# Patient Record
Sex: Male | Born: 1980 | Race: White | Hispanic: No | Marital: Married | Smoking: Never smoker
Health system: Southern US, Community
[De-identification: ages and names within clinical notes are randomized; demographics above are authoritative.]

## PROBLEM LIST (undated history)

## (undated) DIAGNOSIS — K5792 Diverticulitis of intestine, part unspecified, without perforation or abscess without bleeding: Secondary | ICD-10-CM

---

## 2018-06-25 ENCOUNTER — Encounter: Payer: Self-pay | Admitting: Emergency Medicine

## 2018-06-25 ENCOUNTER — Emergency Department: Payer: Managed Care, Other (non HMO)

## 2018-06-25 ENCOUNTER — Emergency Department
Admission: EM | Admit: 2018-06-25 | Discharge: 2018-06-25 | Disposition: A | Discharge status: Home / Self Care | Payer: Managed Care, Other (non HMO) | Attending: Emergency Medicine | Admitting: Emergency Medicine

## 2018-06-25 DIAGNOSIS — M545 Low back pain: Secondary | ICD-10-CM | POA: Diagnosis not present

## 2018-06-25 DIAGNOSIS — M7918 Myalgia, other site: Secondary | ICD-10-CM

## 2018-06-25 MED ORDER — CYCLOBENZAPRINE HCL 10 MG PO TABS: 10.0000 mg | ORAL_TABLET | Freq: Three times a day (TID) | ORAL | 0 refills | Status: DC | PRN

## 2018-06-25 MED ORDER — CYCLOBENZAPRINE HCL 10 MG PO TABS
10.0000 mg | ORAL_TABLET | Freq: Once | ORAL | Status: AC
  Administered 2018-06-25: 10 mg via ORAL
  Filled 2018-06-25: qty 1

## 2018-06-25 MED ORDER — TRAMADOL HCL 50 MG PO TABS
50.0000 mg | ORAL_TABLET | Freq: Once | ORAL | Status: AC
  Administered 2018-06-25: 50 mg via ORAL
  Filled 2018-06-25: qty 1

## 2018-06-25 MED ORDER — IBUPROFEN 600 MG PO TABS: 600.0000 mg | ORAL_TABLET | Freq: Three times a day (TID) | ORAL | 0 refills | Status: DC | PRN

## 2018-06-25 MED ORDER — IBUPROFEN 600 MG PO TABS
600.0000 mg | ORAL_TABLET | Freq: Once | ORAL | Status: AC
  Administered 2018-06-25: 600 mg via ORAL
  Filled 2018-06-25: qty 1

## 2018-06-25 MED ORDER — TRAMADOL HCL 50 MG PO TABS: 50.0000 mg | ORAL_TABLET | Freq: Four times a day (QID) | ORAL | 0 refills | Status: DC | PRN

## 2018-06-25 NOTE — Discharge Instructions (Addendum)
Follow discharge care instruction take medication as directed. °
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
## 2019-02-28 ENCOUNTER — Emergency Department
Admission: EM | Admit: 2019-02-28 | Discharge: 2019-02-28 | Disposition: A | Discharge status: Home / Self Care | Payer: Managed Care, Other (non HMO) | Attending: Student | Admitting: Student

## 2019-02-28 ENCOUNTER — Other Ambulatory Visit: Payer: Self-pay

## 2019-02-28 ENCOUNTER — Emergency Department: Payer: Managed Care, Other (non HMO)

## 2019-02-28 ENCOUNTER — Encounter: Payer: Self-pay | Admitting: Emergency Medicine

## 2019-02-28 DIAGNOSIS — Y939 Activity, unspecified: Secondary | ICD-10-CM | POA: Diagnosis not present

## 2019-02-28 DIAGNOSIS — S161XXA Strain of muscle, fascia and tendon at neck level, initial encounter: Secondary | ICD-10-CM | POA: Insufficient documentation

## 2019-02-28 DIAGNOSIS — M7918 Myalgia, other site: Secondary | ICD-10-CM | POA: Diagnosis not present

## 2019-02-28 DIAGNOSIS — S199XXA Unspecified injury of neck, initial encounter: Secondary | ICD-10-CM | POA: Diagnosis present

## 2019-02-28 DIAGNOSIS — Y92410 Unspecified street and highway as the place of occurrence of the external cause: Secondary | ICD-10-CM | POA: Diagnosis not present

## 2019-02-28 DIAGNOSIS — Y999 Unspecified external cause status: Secondary | ICD-10-CM | POA: Insufficient documentation

## 2019-02-28 DIAGNOSIS — R413 Other amnesia: Secondary | ICD-10-CM | POA: Diagnosis not present

## 2019-02-28 MED ORDER — IBUPROFEN 600 MG PO TABS: 600.0000 mg | ORAL_TABLET | Freq: Three times a day (TID) | ORAL | 0 refills | Status: DC | PRN

## 2019-02-28 MED ORDER — CYCLOBENZAPRINE HCL 10 MG PO TABS: 10.0000 mg | ORAL_TABLET | Freq: Three times a day (TID) | ORAL | 0 refills | Status: DC | PRN

## 2019-02-28 MED ORDER — TRAMADOL HCL 50 MG PO TABS: 50.0000 mg | ORAL_TABLET | Freq: Four times a day (QID) | ORAL | 0 refills | Status: AC | PRN
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
## 2020-07-03 ENCOUNTER — Encounter: Payer: Self-pay | Admitting: Intensive Care

## 2020-07-03 ENCOUNTER — Other Ambulatory Visit: Payer: Self-pay

## 2020-07-03 ENCOUNTER — Emergency Department
Admission: EM | Admit: 2020-07-03 | Discharge: 2020-07-03 | Disposition: A | Discharge status: Home / Self Care | Payer: Managed Care, Other (non HMO) | Attending: Emergency Medicine | Admitting: Emergency Medicine

## 2020-07-03 ENCOUNTER — Emergency Department: Payer: Managed Care, Other (non HMO)

## 2020-07-03 DIAGNOSIS — K5732 Diverticulitis of large intestine without perforation or abscess without bleeding: Secondary | ICD-10-CM | POA: Diagnosis not present

## 2020-07-03 DIAGNOSIS — R109 Unspecified abdominal pain: Secondary | ICD-10-CM | POA: Diagnosis present

## 2020-07-03 DIAGNOSIS — K5792 Diverticulitis of intestine, part unspecified, without perforation or abscess without bleeding: Secondary | ICD-10-CM

## 2020-07-03 LAB — CBC
HCT: 45.4 % (ref 39.0–52.0)
Hemoglobin: 15.7 g/dL (ref 13.0–17.0)
MCH: 30.7 pg (ref 26.0–34.0)
MCHC: 34.6 g/dL (ref 30.0–36.0)
MCV: 88.7 fL (ref 80.0–100.0)
Platelets: 223 10*3/uL (ref 150–400)
RBC: 5.12 MIL/uL (ref 4.22–5.81)
RDW: 12 % (ref 11.5–15.5)
WBC: 12.6 10*3/uL — ABNORMAL HIGH (ref 4.0–10.5)
nRBC: 0 % (ref 0.0–0.2)

## 2020-07-03 LAB — COMPREHENSIVE METABOLIC PANEL
ALT: 31 U/L (ref 0–44)
AST: 26 U/L (ref 15–41)
Albumin: 4.4 g/dL (ref 3.5–5.0)
Alkaline Phosphatase: 59 U/L (ref 38–126)
Anion gap: 9 (ref 5–15)
BUN: 18 mg/dL (ref 6–20)
CO2: 24 mmol/L (ref 22–32)
Calcium: 9.9 mg/dL (ref 8.9–10.3)
Chloride: 106 mmol/L (ref 98–111)
Creatinine, Ser: 0.96 mg/dL (ref 0.61–1.24)
GFR, Estimated: >60 mL/min (ref >60)
Glucose, Bld: 122 mg/dL — ABNORMAL HIGH (ref 70–99)
Potassium: 3.8 mmol/L (ref 3.5–5.1)
Sodium: 139 mmol/L (ref 135–145)
Total Bilirubin: 1.1 mg/dL (ref 0.3–1.2)
Total Protein: 8 g/dL (ref 6.5–8.1)

## 2020-07-03 LAB — LIPASE, BLOOD: Lipase: 42 U/L (ref 11–51)

## 2020-07-03 MED ORDER — AMOXICILLIN-POT CLAVULANATE 875-125 MG PO TABS: 1.0000 | ORAL_TABLET | Freq: Two times a day (BID) | ORAL | 0 refills | Status: DC

## 2020-07-03 MED ORDER — KETOROLAC TROMETHAMINE 30 MG/ML IJ SOLN
30.0000 mg | Freq: Once | INTRAMUSCULAR | Status: AC
  Administered 2020-07-03: 30 mg via INTRAMUSCULAR
  Filled 2020-07-03: qty 1

## 2020-07-03 MED ORDER — AMOXICILLIN-POT CLAVULANATE 875-125 MG PO TABS: 1.0000 | ORAL_TABLET | Freq: Two times a day (BID) | ORAL | 0 refills | Status: AC

## 2020-07-03 MED ORDER — HYDROCODONE-ACETAMINOPHEN 5-325 MG PO TABS: 1.0000 | ORAL_TABLET | Freq: Four times a day (QID) | ORAL | 0 refills | Status: DC | PRN

## 2020-07-03 NOTE — ED Notes (Signed)
Pt c/o RLQ abdominal pain that radiates to the groin since yesterday morning. Pt denies diarrhea/emesis. Pt reports nausea.

## 2020-07-03 NOTE — ED Notes (Signed)
Pt given urine strainer and specimen cup. °

## 2020-07-03 NOTE — ED Provider Notes (Signed)
[facility] Emergency Department Provider Note   ____________________________________________    I have reviewed the triage vital signs and the nursing notes.   HISTORY  Chief Complaint Abdominal Pain     HPI Shane Pierce is a 40 y.o. male who presents with complaints of right flank pain.  Patient reports pain started yesterday morning and worsened throughout the day.  He reports it is difficult to sleep last night because of the pain.  Went to urgent care and they sent her to the ED for further evaluation.  Denies dysuria, does not think he has had hematuria.  Positive nausea.  No history of abdominal surgery.  No fevers chills or cough.  History reviewed. No pertinent past medical history.  There are no problems to display for this patient.   History reviewed. No pertinent surgical history.  Prior to Admission medications   Medication Sig Start Date End Date Taking? Authorizing Provider  amoxicillin-clavulanate (AUGMENTIN) 875-125 MG tablet Take 1 tablet by mouth 2 (two) times daily for 7 days. 07/03/20 07/10/20  Jene Every, MD  cyclobenzaprine (FLEXERIL) 10 MG tablet Take 1 tablet (10 mg total) by mouth 3 (three) times daily as needed. 02/28/19   Joni Reining, PA-C  HYDROcodone-acetaminophen (NORCO/VICODIN) 5-325 MG tablet Take 1 tablet by mouth every 6 (six) hours as needed for moderate pain. 07/03/20   Jene Every, MD  ibuprofen (ADVIL) 600 MG tablet Take 1 tablet (600 mg total) by mouth every 8 (eight) hours as needed. 02/28/19   Joni Reining, PA-C     Allergies Patient has no known allergies.  History reviewed. No pertinent family history.  Social History Social History   Tobacco Use  . Smoking status: Never Smoker  . Smokeless tobacco: Never Used  Substance Use Topics  . Alcohol use: Not Currently  . Drug use: Never    Review of Systems  Constitutional: No fever/chills Eyes: No visual changes.  ENT: No sore  throat. Cardiovascular: Denies chest pain. Respiratory: Denies shortness of breath. Gastrointestinal: As above Genitourinary: As above Musculoskeletal: Negative for back pain. Skin: Negative for rash. Neurological: Negative for headaches or weakness   ____________________________________________   PHYSICAL EXAM:  VITAL SIGNS: ED Triage Vitals  Enc Vitals Group     BP 07/03/20 1322 (!) 161/108     Pulse Rate 07/03/20 1322 92     Resp 07/03/20 1322 16     Temp 07/03/20 1322 98.7 F (37.1 C)     Temp Source 07/03/20 1322 Oral     SpO2 07/03/20 1322 97 %     Weight 07/03/20 1323 97.1 kg (214 lb)     Height 07/03/20 1323 1.676 m (5\' 6" )     Head Circumference --      Peak Flow --      Pain Score 07/03/20 1322 6     Pain Loc --      Pain Edu? --      Excl. in GC? --     Constitutional: Alert and oriented.   Nose: No congestion/rhinnorhea. Mouth/Throat: Mucous membranes are moist.   Neck:  Painless ROM Cardiovascular: Normal rate, regular rhythm. Grossly normal heart sounds.  Good peripheral circulation. Respiratory: Normal respiratory effort.  No retractions. Lungs CTAB. Gastrointestinal: Soft, minimal right flank tenderness. No distention.  No CVA tenderness.  Musculoskeletal: No lower extremity tenderness nor edema.  Warm and well perfused Neurologic:  Normal speech and language. No gross focal neurologic deficits are appreciated.  Skin:  Skin is warm, dry and intact. No rash noted. Psychiatric: Mood and affect are normal. Speech and behavior are normal.  ____________________________________________   LABS (all labs ordered are listed, but only abnormal results are displayed)  Labs Reviewed  COMPREHENSIVE METABOLIC PANEL - Abnormal; Notable for the following components:      Result Value   Glucose, Bld 122 (*)    All other components within normal limits  CBC - Abnormal; Notable for the following components:   WBC 12.6 (*)    All other components within normal  limits  LIPASE, BLOOD  URINALYSIS, COMPLETE (UACMP) WITH MICROSCOPIC   ____________________________________________  EKG  None ____________________________________________  RADIOLOGY CT renal stone study reviewed by me, ____________________________________________   PROCEDURES  Procedure(s) performed: No  Procedures   Critical Care performed: No ____________________________________________   INITIAL IMPRESSION / ASSESSMENT AND PLAN / ED COURSE  Pertinent labs & imaging results that were available during my care of the patient were reviewed by me and considered in my medical decision making (see chart for details).  Patient presents with right flank pain primarily as described above.  Suspicious for ureterolithiasis, reportedly gave urine at urgent care which was negative for hemoglobin, will try to repeat here.  Differential also includes appendicitis however location of pain would be atypical.  Will treat with IM Toradol, sent for CT renal stone study and reevaluate.   CT scan is consistent with diverticulitis, no evidence of abscess, white blood cell count is mildly elevated, the patient is overall well-appearing, will treat with antibiotics, analgesics, discussed stool behavior.  Outpatient follow-up as needed.  Return precautions discussed    ____________________________________________   FINAL CLINICAL IMPRESSION(S) / ED DIAGNOSES  Final diagnoses:  Diverticulitis        Note:  This document was prepared using Dragon voice recognition software and may include unintentional dictation errors.   Jene Every, MD 07/03/20 [phone]

## 2020-07-03 NOTE — ED Triage Notes (Signed)
C/o abdominal pain with nausea. Worse with movement. Sent by [facility]

## 2020-12-19 ENCOUNTER — Encounter: Payer: Self-pay | Admitting: *Deleted

## 2020-12-19 ENCOUNTER — Emergency Department
Admission: EM | Admit: 2020-12-19 | Discharge: 2020-12-19 | Disposition: A | Discharge status: Home / Self Care | Payer: Managed Care, Other (non HMO) | Attending: Emergency Medicine | Admitting: Emergency Medicine

## 2020-12-19 ENCOUNTER — Other Ambulatory Visit: Payer: Self-pay

## 2020-12-19 ENCOUNTER — Emergency Department: Payer: Managed Care, Other (non HMO)

## 2020-12-19 DIAGNOSIS — R1031 Right lower quadrant pain: Secondary | ICD-10-CM | POA: Diagnosis present

## 2020-12-19 DIAGNOSIS — K5792 Diverticulitis of intestine, part unspecified, without perforation or abscess without bleeding: Secondary | ICD-10-CM

## 2020-12-19 HISTORY — DX: Diverticulitis of intestine, part unspecified, without perforation or abscess without bleeding: K57.92

## 2020-12-19 LAB — URINALYSIS, COMPLETE (UACMP) WITH MICROSCOPIC
Bacteria, UA: NONE SEEN
Bilirubin Urine: NEGATIVE
Glucose, UA: NEGATIVE mg/dL
Hgb urine dipstick: NEGATIVE
Ketones, ur: NEGATIVE mg/dL
Leukocytes,Ua: NEGATIVE
Nitrite: NEGATIVE
Protein, ur: NEGATIVE mg/dL
Specific Gravity, Urine: 1.028 (ref 1.005–1.030)
Squamous Epithelial / HPF: NONE SEEN (ref 0–5)
pH: 5 (ref 5.0–8.0)

## 2020-12-19 LAB — CBC
HCT: 42.8 % (ref 39.0–52.0)
Hemoglobin: 15.4 g/dL (ref 13.0–17.0)
MCH: 32 pg (ref 26.0–34.0)
MCHC: 36 g/dL (ref 30.0–36.0)
MCV: 88.8 fL (ref 80.0–100.0)
Platelets: 218 10*3/uL (ref 150–400)
RBC: 4.82 MIL/uL (ref 4.22–5.81)
RDW: 12.7 % (ref 11.5–15.5)
WBC: 12.7 10*3/uL — ABNORMAL HIGH (ref 4.0–10.5)
nRBC: 0 % (ref 0.0–0.2)

## 2020-12-19 LAB — COMPREHENSIVE METABOLIC PANEL
ALT: 33 U/L (ref 0–44)
AST: 30 U/L (ref 15–41)
Albumin: 4.4 g/dL (ref 3.5–5.0)
Alkaline Phosphatase: 64 U/L (ref 38–126)
Anion gap: 7 (ref 5–15)
BUN: 16 mg/dL (ref 6–20)
CO2: 23 mmol/L (ref 22–32)
Calcium: 9.6 mg/dL (ref 8.9–10.3)
Chloride: 106 mmol/L (ref 98–111)
Creatinine, Ser: 0.93 mg/dL (ref 0.61–1.24)
GFR, Estimated: >60 mL/min (ref >60)
Glucose, Bld: 122 mg/dL — ABNORMAL HIGH (ref 70–99)
Potassium: 3.8 mmol/L (ref 3.5–5.1)
Sodium: 136 mmol/L (ref 135–145)
Total Bilirubin: 1.2 mg/dL (ref 0.3–1.2)
Total Protein: 7.7 g/dL (ref 6.5–8.1)

## 2020-12-19 LAB — LIPASE, BLOOD: Lipase: 34 U/L (ref 11–51)

## 2020-12-19 MED ORDER — ONDANSETRON 4 MG PO TBDP
4.0000 mg | ORAL_TABLET | Freq: Once | ORAL | Status: DC | PRN
  Filled 2020-12-19: qty 1

## 2020-12-19 MED ORDER — IOHEXOL 350 MG/ML SOLN
75.0000 mL | Freq: Once | INTRAVENOUS | Status: AC | PRN
  Administered 2020-12-19: 75 mL via INTRAVENOUS
  Filled 2020-12-19: qty 75

## 2020-12-19 MED ORDER — HYDROCODONE-ACETAMINOPHEN 5-325 MG PO TABS: 1.0000 | ORAL_TABLET | ORAL | 0 refills | Status: AC | PRN

## 2020-12-19 MED ORDER — AMOXICILLIN-POT CLAVULANATE 875-125 MG PO TABS: 1.0000 | ORAL_TABLET | Freq: Two times a day (BID) | ORAL | 0 refills | Status: AC

## 2020-12-19 NOTE — ED Provider Notes (Signed)
[facility] Emergency Department Provider Note  Time seen: 10:43 AM  I have reviewed the triage vital signs and the nursing notes.   HISTORY  Chief Complaint Abdominal Pain (RLQ)   HPI Shane Pierce is a 40 y.o. male with a past medical history of diverticulitis approximately 2 to 3 months ago presents to the emergency department for right lower quadrant abdominal pain.  According to the patient 2 to 3 months ago he was seen for acute diverticulitis, finished a course of antibiotics and this improved.  He states over the past 2 days he has began experiencing mild dull pain in the right lower quadrant low-grade subjective temperature at home.  Patient states it feels identical to when he had diverticulitis.  Patient states he has been eating better and working out.  I discussed with the patient seeds and nuts which he states he has been eating regularly as he was not aware that he should stop eating these.  Patient denies any fever.  Has had mild loose stool but denies any black or bloody stool.  No nausea or vomiting.   Past Medical History:  Diagnosis Date   Diverticulitis     There are no problems to display for this patient.   History reviewed. No pertinent surgical history.  Prior to Admission medications   Medication Sig Start Date End Date Taking? Authorizing Provider  ibuprofen (ADVIL) 600 MG tablet Take 1 tablet (600 mg total) by mouth every 8 (eight) hours as needed. 02/28/19   Joni Reining, PA-C    No Known Allergies  History reviewed. No pertinent family history.  Social History Social History   Tobacco Use   Smoking status: Never   Smokeless tobacco: Never  Substance Use Topics   Alcohol use: Not Currently   Drug use: Never    Review of Systems Constitutional: Negative for fever. Cardiovascular: Negative for chest pain. Respiratory: Negative for shortness of breath. Gastrointestinal: Right lower quadrant abdominal pain, mild to  moderate, dull.  Negative for nausea vomiting.  Positive for loose stool. Genitourinary: Negative for urinary compaints Musculoskeletal: Negative for musculoskeletal complaints Neurological: Negative for headache All other ROS negative  ____________________________________________   PHYSICAL EXAM:  VITAL SIGNS: ED Triage Vitals  Enc Vitals Group     BP 12/19/20 0645 (!) 151/96     Pulse Rate 12/19/20 0645 86     Resp 12/19/20 0645 20     Temp 12/19/20 0645 99.2 F (37.3 C)     Temp Source 12/19/20 0645 Oral     SpO2 12/19/20 0645 94 %     Weight 12/19/20 0647 205 lb (93 kg)     Height 12/19/20 0647 5\' 6"  (1.676 m)     Head Circumference --      Peak Flow --      Pain Score 12/19/20 0647 8     Pain Loc --      Pain Edu? --      Excl. in GC? --    Constitutional: Alert and oriented. Well appearing and in no distress. Eyes: Normal exam ENT      Head: Normocephalic and atraumatic.      Mouth/Throat: Mucous membranes are moist. Cardiovascular: Normal rate, regular rhythm.  Respiratory: Normal respiratory effort without tachypnea nor retractions. Breath sounds are clear  Gastrointestinal: Soft, mild right lower quadrant tenderness palpation without rebound guarding or distention. Musculoskeletal: Nontender with normal range of motion in all extremities.  Neurologic:  Normal speech and language. No  gross focal neurologic deficits  Skin:  Skin is warm, dry and intact.  Psychiatric: Mood and affect are normal.   ____________________________________________   RADIOLOGY  CT consistent with acute diverticulitis  ____________________________________________   INITIAL IMPRESSION / ASSESSMENT AND PLAN / ED COURSE  Pertinent labs & imaging results that were available during my care of the patient were reviewed by me and considered in my medical decision making (see chart for details).   Patient presents emergency department for right lower quadrant abdominal pain for the past  2 days.  History of diverticulitis 2 to 3 months ago with similar presentation per patient.  Patient still has his appendix.  99.2 temperature in the emergency department slight leukocytosis 12,700.  We will proceed with a CT scan of the abdomen/pelvis to evaluate.  Differential at this time would include diverticulitis versus appendicitis.  CT consistent with acute uncomplicated diverticulitis.  I discussed with the patient follow-up with GI medicine for colonoscopy given the enlarged lymph nodes.  Patient agreeable to plan of care.  Discussed avoiding seeds and nuts.  We will place the patient on Augmentin.  Discussed return precautions.  Cyncere Sontag was evaluated in Emergency Department on 12/19/2020 for the symptoms described in the history of present illness. He was evaluated in the context of the global COVID-19 pandemic, which necessitated consideration that the patient might be at risk for infection with the SARS-CoV-2 virus that causes COVID-19. Institutional protocols and algorithms that pertain to the evaluation of patients at risk for COVID-19 are in a state of rapid change based on information released by regulatory bodies including the CDC and federal and state organizations. These policies and algorithms were followed during the patient's care in the ED.  ____________________________________________   FINAL CLINICAL IMPRESSION(S) / ED DIAGNOSES  Abdominal pain Diverticulitis   Minna Antis, MD 12/19/20 1132

## 2020-12-19 NOTE — ED Triage Notes (Signed)
Pt C/O RLQ pain x 2 days. Pt states pain is worsening. Pt c/o diarrhea, denies n/v.

## 2021-04-08 IMAGING — CT CT RENAL STONE PROTOCOL
2 of 4 series · 16 of 46 positions shown, 18 images · non-contrast
Comparison: None.

CLINICAL DATA: Acute right lower quadrant abdominal pain.

EXAM:
CT ABDOMEN AND PELVIS WITHOUT CONTRAST
TECHNIQUE: Multidetector CT imaging of the abdomen and pelvis was performed
following the standard protocol without IV contrast.

[Series 2: stone full standard (person_name) · axial · 0.81mm/px · z∈[-352,+178]mm · 13 of 116 slices shown, 15 images]
[im 5/116  soft-tissue]
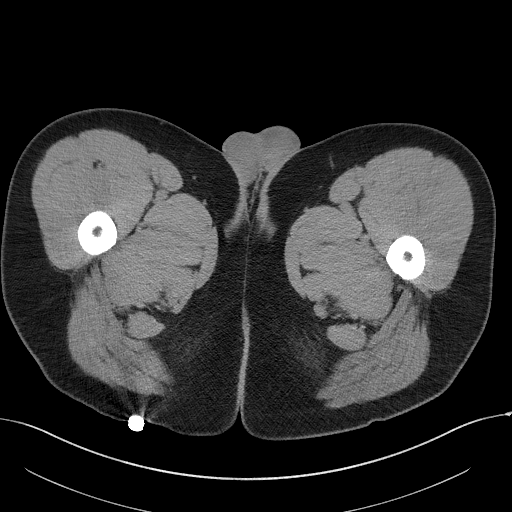
[im 5/116  bone]
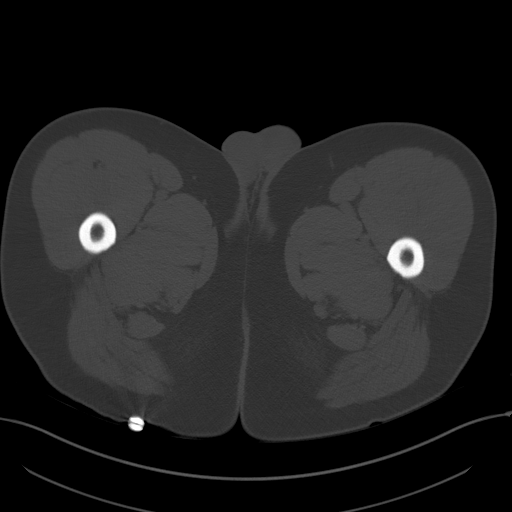
[im 15/116  soft-tissue]
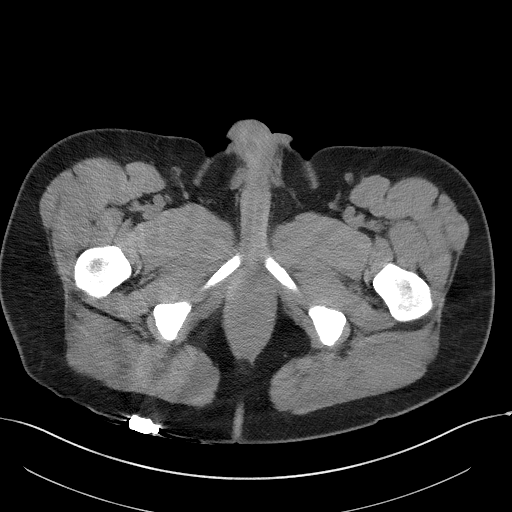
[im 24/116  soft-tissue]
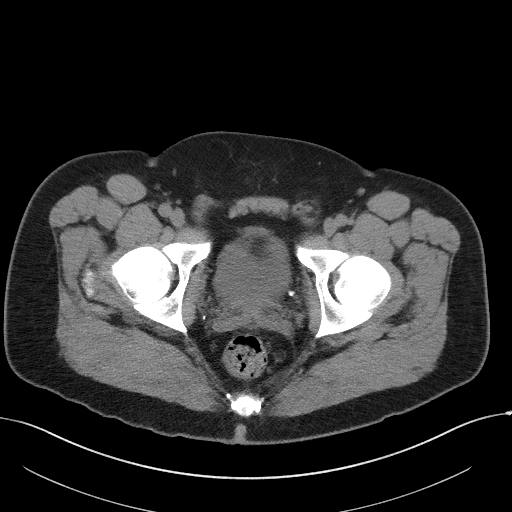
[im 34/116  soft-tissue]
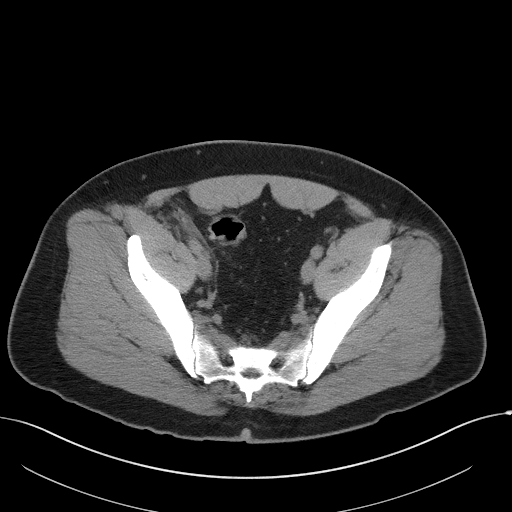
[im 39/116  soft-tissue]
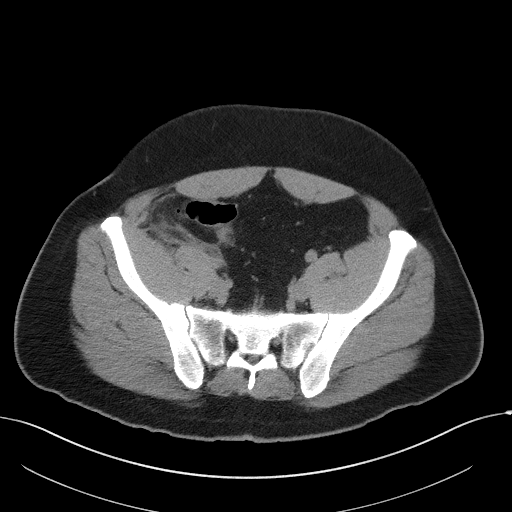
[im 48/116  soft-tissue]
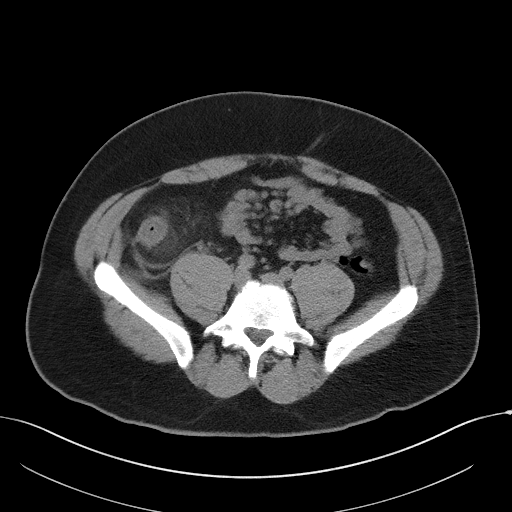
[im 58/116  soft-tissue]
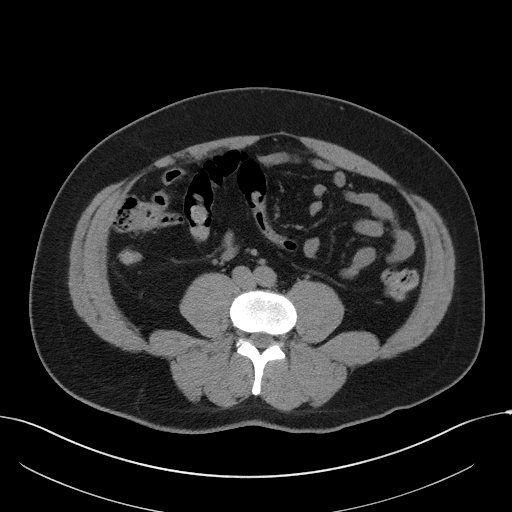
[im 68/116  soft-tissue]
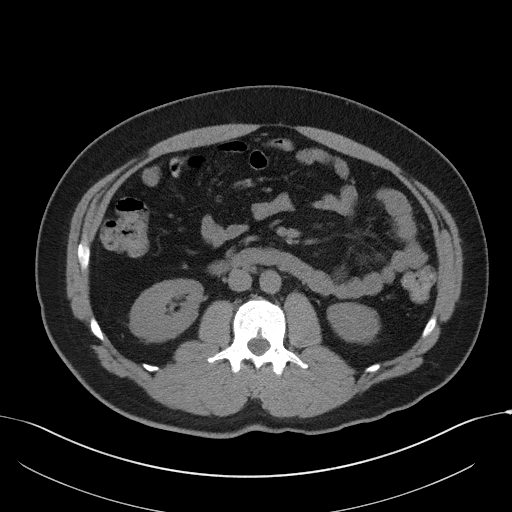
[im 77/116  soft-tissue]
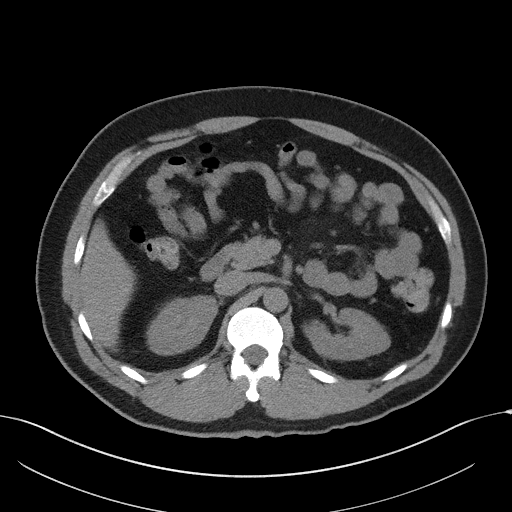
[im 77/116  bone]
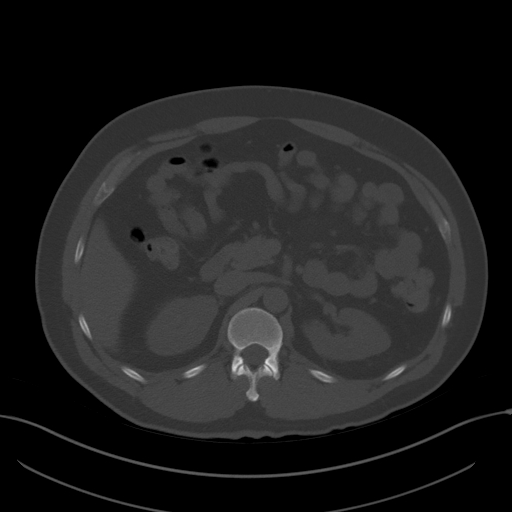
[im 82/116  soft-tissue]
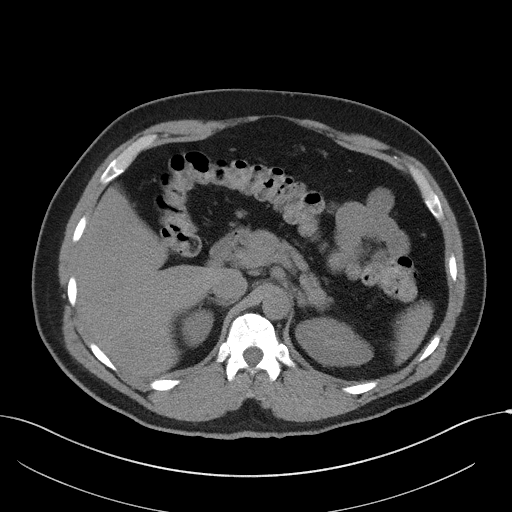
[im 92/116  soft-tissue]
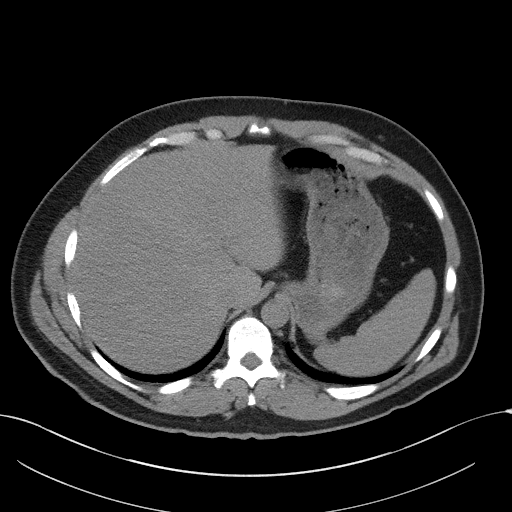
[im 101/116  soft-tissue]
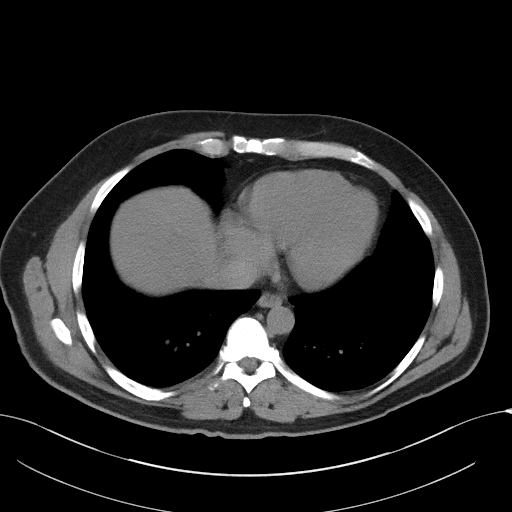
[im 111/116  soft-tissue]
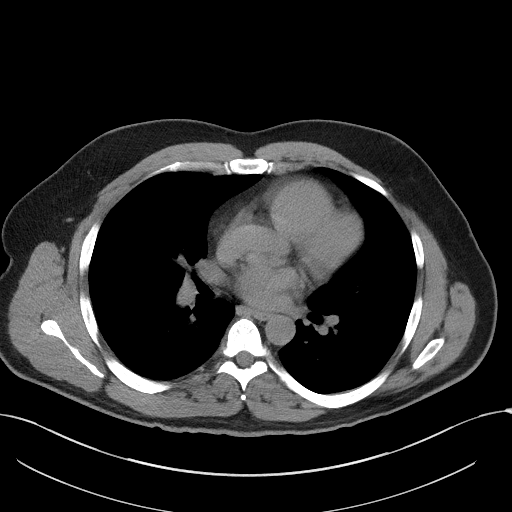

[Series 5: coronal · coronal · 0.84mm/px · 3 of 133 slices shown]
[im 45/133  soft-tissue]
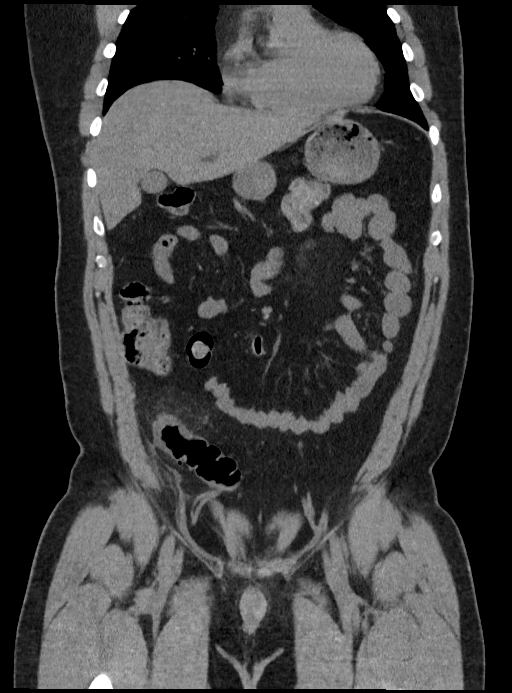
[im 59/133  soft-tissue]
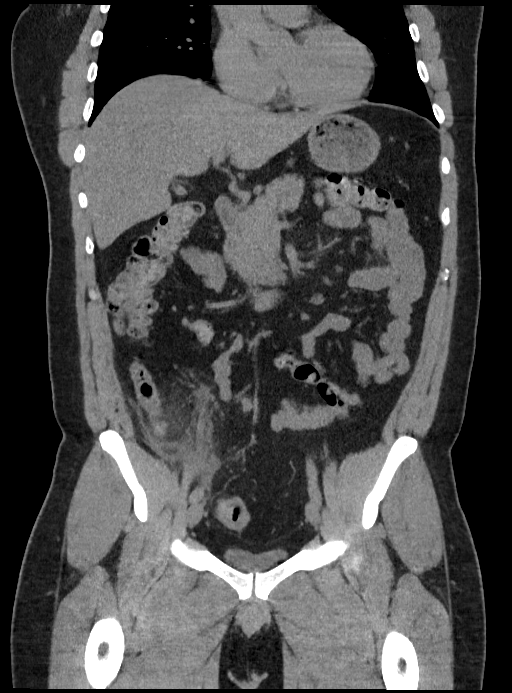
[im 74/133  soft-tissue]
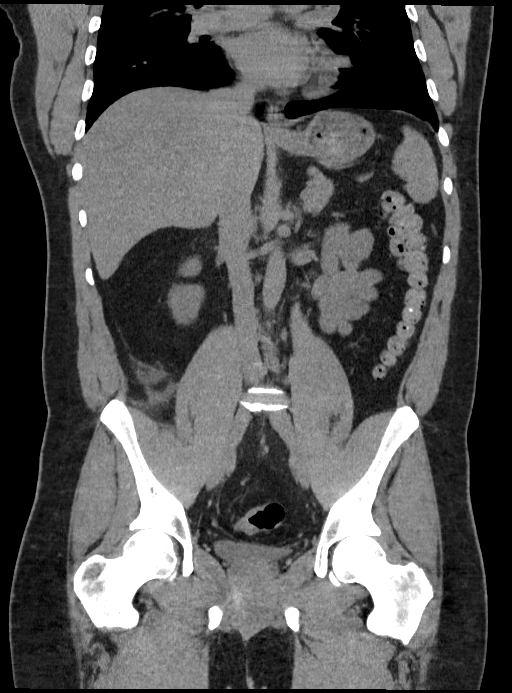

[16 of 46 positions shown; findings below may reference images not displayed]

FINDINGS: Lower chest: No acute abnormality.

Hepatobiliary: Hepatic steatosis is noted. No gallstones or biliary
dilatation is noted.

Pancreas: Unremarkable. No pancreatic ductal dilatation or
surrounding inflammatory changes.

Spleen: Normal in size without focal abnormality.

Adrenals/Urinary Tract: Adrenal glands appear normal. Small
nonobstructive right renal calculus is noted. No hydronephrosis or
renal obstruction is noted. Urinary bladder is decompressed.

Stomach/Bowel: The stomach and appendix are unremarkable. Focal
diverticulitis is seen involving the sigmoid colon without abscess
formation. There is no evidence of bowel obstruction.

Vascular/Lymphatic: No significant vascular findings are present. No
enlarged abdominal or pelvic lymph nodes.

Reproductive: Prostate is unremarkable.

Other: No abdominal wall hernia or abnormality. No abdominopelvic
ascites.

Musculoskeletal: No acute or significant osseous findings.
IMPRESSION: 1. Focal diverticulitis is seen involving the sigmoid colon without
abscess formation.
2. Hepatic steatosis.
3. Small nonobstructive right renal calculus. No hydronephrosis or
renal obstruction is noted.

## 2023-10-04 ENCOUNTER — Ambulatory Visit: Admitting: Urology

## 2023-10-04 VITALS — BP 163/97 | HR 71 | Ht 66.0 in | Wt 208.1 lb

## 2023-10-04 DIAGNOSIS — N400 Enlarged prostate without lower urinary tract symptoms: Secondary | ICD-10-CM

## 2023-10-04 DIAGNOSIS — N529 Male erectile dysfunction, unspecified: Secondary | ICD-10-CM

## 2023-10-04 DIAGNOSIS — R399 Unspecified symptoms and signs involving the genitourinary system: Secondary | ICD-10-CM | POA: Diagnosis not present

## 2023-10-04 MED ORDER — TADALAFIL 5 MG PO TABS: 5.0000 mg | ORAL_TABLET | Freq: Every day | ORAL | 11 refills | Status: AC

## 2023-10-04 NOTE — Progress Notes (Signed)
   10/04/23 3:38 PM   Shane Pierce August 16, 1980 [id number]  CC: ED, urinary frequency  HPI: 43 year old male who reports with at least a few years of intermittent problems with erections as well as some urinary frequency.  He has an energy drink in the morning and then consumes only water during the day.  He does not have a PCP, does have a history of hypertension on ER visits, and blood pressure today 163/97.  He denies any dysuria or gross hematuria.  Nocturia 0-1 time overnight.  Has never tried medications for erections, no prior testosterone values to review.   PMH: Past Medical History:  Diagnosis Date   Diverticulitis      Family History: No family history on file.  Social History:  reports that he has never smoked. He has never used smokeless tobacco. He reports that he does not currently use alcohol. He reports that he does not use drugs.  Physical Exam: BP (!) 163/97   Pulse 71   Ht 5\' 6"  (1.676 m)   Wt 208 lb 2 oz (94.4 kg)   BMI 33.59 kg/m    Constitutional:  Alert and oriented, No acute distress. Cardiovascular: No clubbing, cyanosis, or edema. Respiratory: Normal respiratory effort, no increased work of breathing. GI: Abdomen is soft, nontender, nondistended, no abdominal masses GU: Circumcised phallus with patent meatus, no lesions, testicles 20 cc of descended bilaterally   Assessment & Plan:   43 year old male with 1 to 2 years of intermittent ED as well as some urinary frequency.  There may be a psychogenic component based on his history.  I recommended a trial of Cialis 5 mg daily which will likely help with both his ED and urinary symptoms, also discussed avoiding bladder irritants.  Will check testosterone per the guideline recommendations.  Finally I recommended establishing care with a PCP for routine blood work/cholesterol/triglycerides and consideration of antihypertensive medication.  Trial of Cialis 5 mg daily for BPH and ED Behavioral strategies  discussed regarding bladder irritants Recommend establishing care with PCP Check morning testosterone, call with results   Jay Meth, MD 10/04/2023  [facility] [address], [address] [place], [place] [address] [phone]

## 2023-10-04 NOTE — Patient Instructions (Signed)
Tadalafil Tablets (Erectile Dysfunction, BPH) What is this medication? TADALAFIL (tah DA la fil) treats erectile dysfunction (ED). It works by increasing blood flow to the penis, which helps to maintain an erection. It may also be used to treat symptoms of an enlarged prostate (benign prostatic hyperplasia). This medicine may be used for other purposes; ask your health care provider or pharmacist if you have questions. COMMON BRAND NAME(S): Mady Gemma, Cialis What should I tell my care team before I take this medication? They need to know if you have any of these conditions: Abnormal penis shape or Peyronie disease Bleeding disorder Blood diseases, such as sickle cell anemia or leukemia Eye disease, such as retinitis pigmentosa Have had a heart attack Have had a painful and prolonged erection Have had a stroke Heart disease, such as angina, heart failure, irregular heartbeat or rhythm High or low blood pressure Stomach ulcers, other stomach or intestine problems Kidney disease Liver disease An unusual or allergic reaction to tadalafil, other medications, foods, dyes, or preservatives Pregnant or trying to get pregnant Breastfeeding How should I use this medication? Take this medication by mouth with a glass of water. Follow the directions on the prescription label. You may take this medication with or without meals. When this medication is used for erection problems, your care team may prescribe it to be taken once daily or as needed. If you are taking the medication as needed, you may be able to have sexual activity 30 minutes after taking it and for up to 36 hours after taking it. Whether you are taking the medication as needed or once daily, you should not take more than one dose per day. If you are taking this medication for symptoms of benign prostatic hyperplasia (BPH) or to treat both BPH and an erection problem, take the dose once daily at about the same time each day. Do not take  your medication more often than directed. Talk to your care team about the use of this medication in children. Special care may be needed. Overdosage: If you think you have taken too much of this medicine contact a poison control center or emergency room at once. NOTE: This medicine is only for you. Do not share this medicine with others. What if I miss a dose? If you are taking this medication as needed for erection problems, this does not apply. If you miss a dose while taking this medication once daily for an erection problem, benign prostatic hyperplasia, or both, take it as soon as you remember, but do not take more than one dose per day. What may interact with this medication? Do not take this medication with any of the following: Nitrates, such as amyl nitrite, isosorbide dinitrate, isosorbide mononitrate, nitroglycerin Other medications for erectile dysfunction, such as avanafil, sildenafil, vardenafil Other tadalafil products Riociguat Vericiguat This medication may also interact with the following: Alcohol Certain antibiotics, such as clarithromycin or erythromycin Certain antivirals for HIV or hepatitis Certain medications for blood pressure Certain medications for fungal infections, such as fluconazole, itraconazole, ketoconazole, voriconazole Certain medications for seizures, such as carbamazepine, phenytoin, phenobarbital Grapefruit juice Medications for prostate problems Rifabutin, rifampin, or rifapentine Other medications may affect the way this medication works. Talk with your care team about all of the medications you take. They may suggest changes to your treatment plan to lower the risk of side effects and to make sure your medications work as intended. This list may not describe all possible interactions. Give your health care provider a  list of all the medicines, herbs, non-prescription drugs, or dietary supplements you use. Also tell them if you smoke, drink alcohol,  or use illegal drugs. Some items may interact with your medicine. What should I watch for while using this medication? Visit your care team for regular checks on your progress. Tell your care team if your symptoms do not start to get better or if they get worse. Using this medication does not protect you or your partner against HIV or other sexually transmitted infections (STIs). Stop and call your care team right away if you have symptoms such as nausea, dizziness, or chest pain during sex. Contact your care team right away if you have an erection that lasts longer than 4 hours or if it becomes painful. This may be a sign of a serious problem and must be treated right away to prevent permanent damage. What side effects may I notice from receiving this medication? Side effects that you should report to your care team as soon as possible: Allergic reactions--skin rash, itching, hives, swelling of the face, lips, tongue, or throat Hearing loss or ringing in ears Heart attack--pain or tightness in the chest, shoulders, arms, or jaw, nausea, shortness of breath, cold or clammy skin, feeling faint or lightheaded Low blood pressure--dizziness, feeling faint or lightheaded, blurry vision Prolonged or painful erection Redness, blistering, peeling, or loosening of the skin, including inside the mouth Stroke--sudden numbness or weakness of the face, arm, or leg, trouble speaking, confusion, trouble walking, loss of balance or coordination, dizziness, severe headache, change in vision Sudden vision loss in one or both eyes Side effects that usually do not require medical attention (report to your care team if they continue or are bothersome): Back pain Facial flushing or redness Headache Muscle pain Runny or stuffy nose Upset stomach This list may not describe all possible side effects. Call your doctor for medical advice about side effects. You may report side effects to FDA at 1-800-FDA-1088. Where  should I keep my medication? Keep out of the reach of children. Store at room temperature between 15 and 30 degrees C (59 and 86 degrees F). Throw away any unused medication after the expiration date. NOTE: This sheet is a summary. It may not cover all possible information. If you have questions about this medicine, talk to your doctor, pharmacist, or health care provider.  2024 Elsevier/Gold Standard (2022-09-01 00:00:00)
